# Patient Record
Sex: Male | Born: 1985 | Race: Black or African American | Hispanic: No | Marital: Single | Smoking: Former smoker
Health system: Southern US, Community
[De-identification: ages and names within clinical notes are randomized; demographics above are authoritative.]

## PROBLEM LIST (undated history)

## (undated) DIAGNOSIS — D573 Sickle-cell trait: Secondary | ICD-10-CM

## (undated) HISTORY — PX: ELBOW ARTHROSCOPY: SUR87

---

## 2006-11-12 ENCOUNTER — Emergency Department: Payer: Self-pay | Admitting: Emergency Medicine

## 2006-11-17 ENCOUNTER — Emergency Department: Payer: Self-pay | Admitting: Internal Medicine

## 2009-12-25 ENCOUNTER — Emergency Department: Payer: Self-pay | Admitting: Emergency Medicine

## 2012-05-29 ENCOUNTER — Emergency Department: Payer: Self-pay | Admitting: Emergency Medicine

## 2018-08-29 ENCOUNTER — Other Ambulatory Visit: Payer: Self-pay

## 2018-08-29 ENCOUNTER — Emergency Department: Payer: Self-pay

## 2018-08-29 ENCOUNTER — Encounter: Payer: Self-pay | Admitting: Emergency Medicine

## 2018-08-29 ENCOUNTER — Emergency Department
Admission: EM | Admit: 2018-08-29 | Discharge: 2018-08-30 | Disposition: A | Discharge status: Other Acute Inpatient Hospital | Payer: Self-pay | Attending: Emergency Medicine | Admitting: Emergency Medicine

## 2018-08-29 DIAGNOSIS — S53024A Posterior dislocation of right radial head, initial encounter: Secondary | ICD-10-CM | POA: Insufficient documentation

## 2018-08-29 DIAGNOSIS — S43015A Anterior dislocation of left humerus, initial encounter: Secondary | ICD-10-CM | POA: Insufficient documentation

## 2018-08-29 DIAGNOSIS — Y998 Other external cause status: Secondary | ICD-10-CM | POA: Insufficient documentation

## 2018-08-29 DIAGNOSIS — Y9389 Activity, other specified: Secondary | ICD-10-CM | POA: Insufficient documentation

## 2018-08-29 DIAGNOSIS — T07XXXA Unspecified multiple injuries, initial encounter: Secondary | ICD-10-CM

## 2018-08-29 DIAGNOSIS — T1490XA Injury, unspecified, initial encounter: Secondary | ICD-10-CM

## 2018-08-29 DIAGNOSIS — S4292XA Fracture of left shoulder girdle, part unspecified, initial encounter for closed fracture: Secondary | ICD-10-CM

## 2018-08-29 DIAGNOSIS — Q7191 Unspecified reduction defect of right upper limb: Secondary | ICD-10-CM

## 2018-08-29 DIAGNOSIS — Y929 Unspecified place or not applicable: Secondary | ICD-10-CM | POA: Insufficient documentation

## 2018-08-29 DIAGNOSIS — S42401B Unspecified fracture of lower end of right humerus, initial encounter for open fracture: Secondary | ICD-10-CM

## 2018-08-29 DIAGNOSIS — F17200 Nicotine dependence, unspecified, uncomplicated: Secondary | ICD-10-CM | POA: Insufficient documentation

## 2018-08-29 DIAGNOSIS — Q7192 Unspecified reduction defect of left upper limb: Secondary | ICD-10-CM

## 2018-08-29 DIAGNOSIS — S42202A Unspecified fracture of upper end of left humerus, initial encounter for closed fracture: Secondary | ICD-10-CM | POA: Insufficient documentation

## 2018-08-29 DIAGNOSIS — S52501A Unspecified fracture of the lower end of right radius, initial encounter for closed fracture: Secondary | ICD-10-CM | POA: Insufficient documentation

## 2018-08-29 DIAGNOSIS — S80211A Abrasion, right knee, initial encounter: Secondary | ICD-10-CM | POA: Insufficient documentation

## 2018-08-29 DIAGNOSIS — S51811A Laceration without foreign body of right forearm, initial encounter: Secondary | ICD-10-CM | POA: Insufficient documentation

## 2018-08-29 DIAGNOSIS — S80212A Abrasion, left knee, initial encounter: Secondary | ICD-10-CM | POA: Insufficient documentation

## 2018-08-29 DIAGNOSIS — S0083XA Contusion of other part of head, initial encounter: Secondary | ICD-10-CM | POA: Insufficient documentation

## 2018-08-29 LAB — CBC WITH DIFFERENTIAL/PLATELET
BASOS ABS: 0.1 10*3/uL (ref 0–0.1)
BASOS PCT: 1 %
EOS ABS: 0.2 10*3/uL (ref 0–0.7)
Eosinophils Relative: 1 %
HCT: 44.6 % (ref 40.0–52.0)
HEMOGLOBIN: 15.4 g/dL (ref 13.0–18.0)
LYMPHS ABS: 5.9 10*3/uL — AB (ref 1.0–3.6)
Lymphocytes Relative: 36 %
MCH: 30.1 pg (ref 26.0–34.0)
MCHC: 34.5 g/dL (ref 32.0–36.0)
MCV: 87.2 fL (ref 80.0–100.0)
Monocytes Absolute: 1 10*3/uL (ref 0.2–1.0)
Monocytes Relative: 6 %
NEUTROS PCT: 56 %
Neutro Abs: 9 10*3/uL — ABNORMAL HIGH (ref 1.4–6.5)
Platelets: 328 10*3/uL (ref 150–440)
RBC: 5.12 MIL/uL (ref 4.40–5.90)
RDW: 14.1 % (ref 11.5–14.5)
WBC: 16.2 10*3/uL — AB (ref 3.8–10.6)

## 2018-08-29 LAB — ETHANOL: ALCOHOL ETHYL (B): 125 mg/dL — AB (ref <10)

## 2018-08-29 MED ORDER — ONDANSETRON HCL 4 MG/2ML IJ SOLN
4.0000 mg | Freq: Once | INTRAMUSCULAR | Status: AC
  Administered 2018-08-29: 4 mg via INTRAVENOUS
  Filled 2018-08-29: qty 2

## 2018-08-29 MED ORDER — MORPHINE SULFATE (PF) 4 MG/ML IV SOLN
4.0000 mg | Freq: Once | INTRAVENOUS | Status: AC
  Administered 2018-08-29: 4 mg via INTRAVENOUS
  Filled 2018-08-29: qty 1

## 2018-08-29 MED ORDER — SODIUM CHLORIDE 0.9 % IV BOLUS
1000.0000 mL | Freq: Once | INTRAVENOUS | Status: AC
  Administered 2018-08-29: 1000 mL via INTRAVENOUS
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
## 2018-08-30 ENCOUNTER — Emergency Department: Payer: Self-pay

## 2018-08-30 LAB — URINE DRUG SCREEN, QUALITATIVE (ARMC ONLY)
Amphetamines, Ur Screen: NOT DETECTED
Barbiturates, Ur Screen: NOT DETECTED
Benzodiazepine, Ur Scrn: NOT DETECTED
COCAINE METABOLITE, UR ~~LOC~~: POSITIVE — AB
Cannabinoid 50 Ng, Ur ~~LOC~~: NOT DETECTED
MDMA (ECSTASY) UR SCREEN: NOT DETECTED
METHADONE SCREEN, URINE: NOT DETECTED
Opiate, Ur Screen: NOT DETECTED
Phencyclidine (PCP) Ur S: NOT DETECTED
TRICYCLIC, UR SCREEN: NOT DETECTED

## 2018-08-30 LAB — BASIC METABOLIC PANEL
ANION GAP: 24 — AB (ref 5–15)
BUN: 11 mg/dL (ref 6–20)
CHLORIDE: 103 mmol/L (ref 98–111)
CO2: 11 mmol/L — ABNORMAL LOW (ref 22–32)
Calcium: 9.2 mg/dL (ref 8.9–10.3)
Creatinine, Ser: 1.47 mg/dL — ABNORMAL HIGH (ref 0.61–1.24)
Glucose, Bld: 174 mg/dL — ABNORMAL HIGH (ref 70–99)
POTASSIUM: 4.3 mmol/L (ref 3.5–5.1)
Sodium: 138 mmol/L (ref 135–145)

## 2018-08-30 MED ORDER — ETOMIDATE 2 MG/ML IV SOLN
INTRAVENOUS | Status: AC | PRN
  Administered 2018-08-30: 14 mg via INTRAVENOUS

## 2018-08-30 MED ORDER — KETAMINE HCL 50 MG/ML IJ SOLN
INTRAMUSCULAR | Status: AC
  Filled 2018-08-30: qty 10

## 2018-08-30 MED ORDER — MORPHINE SULFATE (PF) 10 MG/ML IJ SOLN
6.00 | INTRAMUSCULAR | Status: DC
Start: ? — End: 2018-08-30

## 2018-08-30 MED ORDER — CEFAZOLIN SODIUM-DEXTROSE 1-4 GM/50ML-% IV SOLN
1.0000 g | Freq: Once | INTRAVENOUS | Status: AC
  Administered 2018-08-30: 1 g via INTRAVENOUS
  Filled 2018-08-30: qty 50

## 2018-08-30 MED ORDER — ETOMIDATE 2 MG/ML IV SOLN
INTRAVENOUS | Status: AC
  Filled 2018-08-30: qty 10

## 2018-08-30 MED ORDER — KETAMINE HCL 10 MG/ML IJ SOLN
INTRAMUSCULAR | Status: AC | PRN
  Administered 2018-08-30: 100 mg via INTRAVENOUS

## 2018-08-30 MED ORDER — MORPHINE SULFATE (PF) 4 MG/ML IV SOLN
INTRAVENOUS | Status: AC
  Filled 2018-08-30: qty 1

## 2018-08-30 NOTE — Sedation Documentation (Signed)
Left shoulder reduced

## 2018-08-30 NOTE — ED Notes (Signed)
Called for EMS and spoke with Carly.

## 2018-08-30 NOTE — Sedation Documentation (Signed)
Pt able to verbalize pain.

## 2018-08-30 NOTE — Sedation Documentation (Signed)
Dr. [place] Cerise reduced the right elbow.

## 2018-08-30 NOTE — Sedation Documentation (Signed)
Dr. [place] Cerise began the reduction.

## 2018-08-30 NOTE — ED Notes (Signed)
EMTALA checked for completion  
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
## 2019-12-21 IMAGING — DX DG SHOULDER 1V*L*
2 series · 2 of 2 positions shown · non-contrast
Comparison: Left shoulder radiograph from earlier today

CLINICAL DATA: Second reduction attempt

EXAM:
LEFT SHOULDER - 1 VIEW

[shoulder ap]
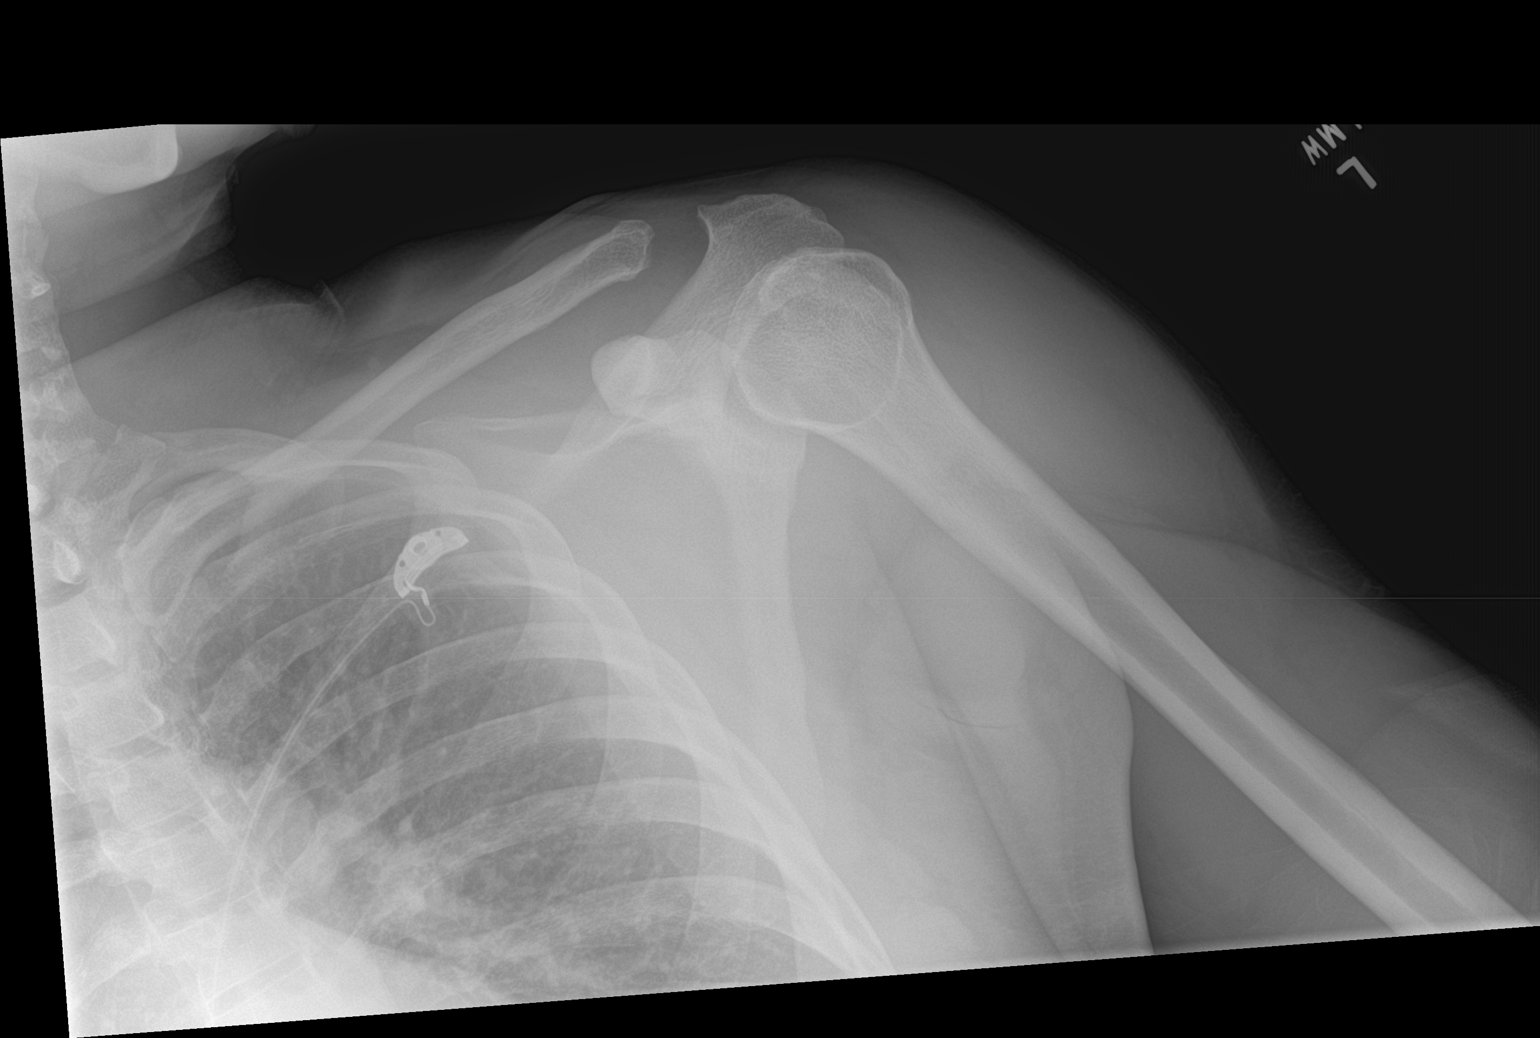

[shoulder obl]
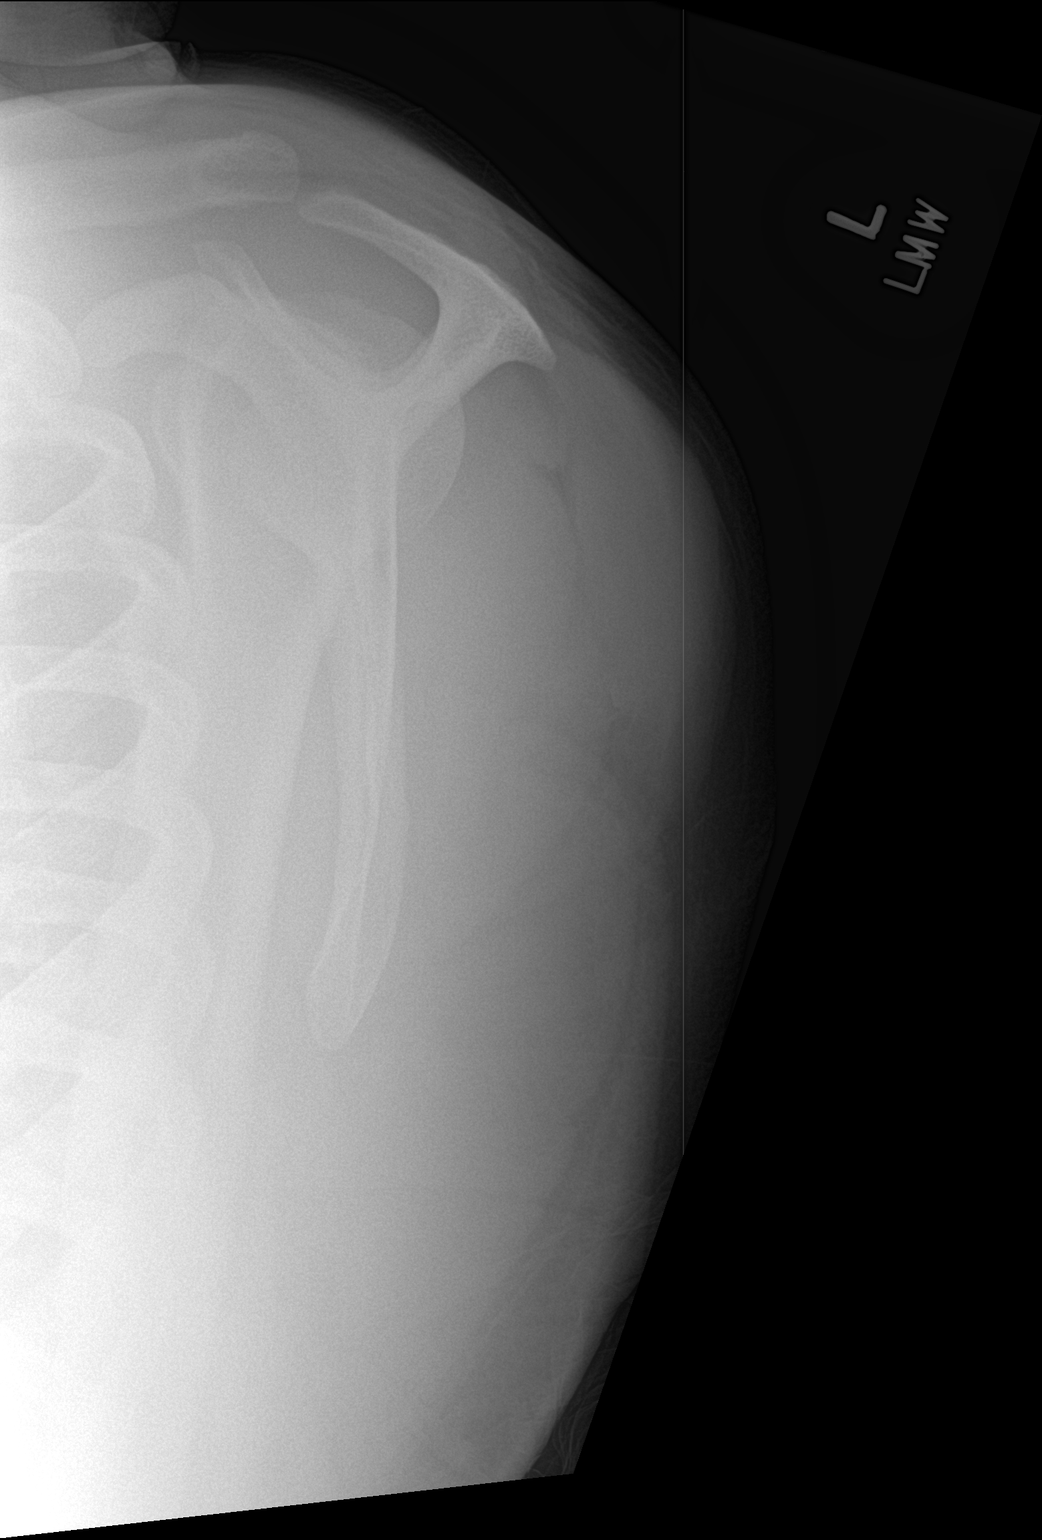

[2 of 2 positions shown; findings below may reference images not displayed]

FINDINGS: Successful reduction with no residual malalignment at the left
glenohumeral joint. Persistent widening at the left
acromioclavicular joint. Hill-Sachs deformity in the left humeral
head superiorly, better seen on prior radiograph. No additional
fracture. No suspicious focal osseous lesion.
IMPRESSION: 1. Successful reduction with no residual malalignment at the left
glenohumeral joint.
2. Persistent widening at the left AC joint suggesting left AC joint
separation of indeterminate chronicity.
3. Hill-Sachs deformity in the left humeral head.

## 2020-07-30 ENCOUNTER — Ambulatory Visit: Payer: Self-pay

## 2021-07-28 ENCOUNTER — Emergency Department
Admission: EM | Admit: 2021-07-28 | Discharge: 2021-07-28 | Disposition: A | Discharge status: Home / Self Care | Payer: Self-pay | Attending: Student in an Organized Health Care Education/Training Program | Admitting: Student in an Organized Health Care Education/Training Program

## 2021-07-28 ENCOUNTER — Other Ambulatory Visit: Payer: Self-pay

## 2021-07-28 DIAGNOSIS — M79601 Pain in right arm: Secondary | ICD-10-CM | POA: Insufficient documentation

## 2021-07-28 DIAGNOSIS — M79604 Pain in right leg: Secondary | ICD-10-CM | POA: Insufficient documentation

## 2021-07-28 DIAGNOSIS — Y92410 Unspecified street and highway as the place of occurrence of the external cause: Secondary | ICD-10-CM | POA: Insufficient documentation

## 2021-07-28 DIAGNOSIS — Z5321 Procedure and treatment not carried out due to patient leaving prior to being seen by health care provider: Secondary | ICD-10-CM | POA: Insufficient documentation

## 2021-07-28 HISTORY — DX: Sickle-cell trait: D57.3
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
## 2021-07-28 NOTE — ED Notes (Signed)
Called pt name x3 to be roomed. No answer. 

## 2022-08-12 ENCOUNTER — Ambulatory Visit: Payer: Self-pay | Admitting: Physician Assistant

## 2022-08-12 ENCOUNTER — Encounter: Payer: Self-pay | Admitting: Physician Assistant

## 2022-08-12 DIAGNOSIS — Z202 Contact with and (suspected) exposure to infections with a predominantly sexual mode of transmission: Secondary | ICD-10-CM

## 2022-08-12 DIAGNOSIS — Z113 Encounter for screening for infections with a predominantly sexual mode of transmission: Secondary | ICD-10-CM

## 2022-08-12 LAB — HM HIV SCREENING LAB: HM HIV Screening: NEGATIVE

## 2022-08-12 MED ORDER — DOXYCYCLINE HYCLATE 100 MG PO TABS: 100.0000 mg | ORAL_TABLET | Freq: Two times a day (BID) | ORAL | 0 refills | Status: AC

## 2022-08-12 NOTE — Progress Notes (Signed)
[facility] Department STI clinic/screening visit  Subjective:  Juan Doyle is a 36 y.o. male being seen today for an STI screening visit. The patient reports they do not have symptoms.    Patient has the following medical conditions:  There are no problems to display for this patient.    Chief Complaint  Patient presents with   SEXUALLY TRANSMITTED DISEASE    Man presents for STD screening and for treatment as contact to partner recently diagnosed with Chlamydia. Pt is asymptomatic.   Does the patient or their partner desires a pregnancy in the next year? Yes Partner currently pregnant.  Screening for MPX risk: Does the patient have an unexplained rash? No has ringworm on neck Is the patient MSM? No Does the patient endorse multiple sex partners or anonymous sex partners? No Did the patient have close or sexual contact with a person diagnosed with MPX? No Has the patient traveled outside the [place] where MPX is endemic? No Is there a high clinical suspicion for MPX-- evidenced by one of the following No  -Unlikely to be chickenpox  -Lymphadenopathy  -Rash that present in same phase of evolution on any given body part  See flowsheet for further details and programmatic requirements.    There is no immunization history on file for this patient.   The following portions of the patient's history were reviewed and updated as appropriate: allergies, current medications, past medical history, past social history, past surgical history and problem list.  Objective:  There were no vitals filed for this visit.  Physical Exam Constitutional:      Appearance: Normal appearance. He is obese.  HENT:     Head: Normocephalic and atraumatic.     Comments: No nits or hair loss    Mouth/Throat:     Mouth: Mucous membranes are moist.     Pharynx: Oropharynx is clear. No oropharyngeal exudate or posterior oropharyngeal erythema.  Cardiovascular:     Rate and Rhythm: Regular  rhythm.  Pulmonary:     Effort: Pulmonary effort is normal.  Abdominal:     General: Abdomen is flat.     Palpations: Abdomen is soft. There is no hepatomegaly or mass.     Tenderness: There is no abdominal tenderness.  Genitourinary:    Pubic Area: No rash or pubic lice (no nits).      Penis: Normal and circumcised. No tenderness, discharge, swelling or lesions.      Testes: Normal.        Right: Mass or tenderness not present.        Left: Mass or tenderness not present.     Epididymis:     Right: Normal. No tenderness.     Left: Normal. No tenderness.     Tanner stage (genital): 5.     Rectum: Normal.  Lymphadenopathy:     Head:     Right side of head: No preauricular or posterior auricular adenopathy.     Left side of head: No preauricular or posterior auricular adenopathy.     Cervical: No cervical adenopathy.     Upper Body:     Right upper body: No supraclavicular, axillary or epitrochlear adenopathy.     Left upper body: No supraclavicular, axillary or epitrochlear adenopathy.     Lower Body: No right inguinal adenopathy. No left inguinal adenopathy.  Skin:    General: Skin is warm and dry.     Findings: No rash.     Comments: Multiple tattoos present,  no rash except oval 1.5 x 3xm area with raised border on L neck  Neurological:     Mental Status: He is alert and oriented to person, place, and time.       Assessment and Plan:  Juan Doyle is a 36 y.o. male presenting to the [facility] Department for STI screening  1. Routine screening for STI (sexually transmitted infection) Probable tinea corporis L neck, f/u with PCP or tx with OTC antifungal. Await STI test results, use condom for STI prevention and recommend STI screen every 6 mo. - Chlamydia/Gonorrhea [facility] - HIV [place] [facility] - Syphilis Serology, [facility]  2. Exposure to sexually transmitted disease (STD) Treat contact to Chlamydia per S.O.   Return in about 6 months (around  02/10/2023) for STI screening.  No future appointments.  Landry Dyke, PA-C

## 2024-08-08 ENCOUNTER — Emergency Department: Payer: Self-pay

## 2024-08-08 ENCOUNTER — Emergency Department
Admission: EM | Admit: 2024-08-08 | Discharge: 2024-08-08 | Disposition: A | Discharge status: Home / Self Care | Payer: Self-pay | Attending: Emergency Medicine | Admitting: Emergency Medicine

## 2024-08-08 ENCOUNTER — Other Ambulatory Visit: Payer: Self-pay

## 2024-08-08 DIAGNOSIS — M545 Low back pain, unspecified: Secondary | ICD-10-CM | POA: Diagnosis present

## 2024-08-08 DIAGNOSIS — Y92410 Unspecified street and highway as the place of occurrence of the external cause: Secondary | ICD-10-CM | POA: Diagnosis not present

## 2024-08-08 DIAGNOSIS — S40012A Contusion of left shoulder, initial encounter: Secondary | ICD-10-CM | POA: Diagnosis not present

## 2024-08-08 DIAGNOSIS — S39012A Strain of muscle, fascia and tendon of lower back, initial encounter: Secondary | ICD-10-CM | POA: Insufficient documentation

## 2024-08-08 MED ORDER — LIDOCAINE 5 % EX PTCH
1.0000 | MEDICATED_PATCH | CUTANEOUS | Status: DC
  Administered 2024-08-08: 1 via TRANSDERMAL
  Filled 2024-08-08: qty 1

## 2024-08-08 MED ORDER — IBUPROFEN 600 MG PO TABS
600.0000 mg | ORAL_TABLET | Freq: Once | ORAL | Status: AC
  Administered 2024-08-08: 600 mg via ORAL
  Filled 2024-08-08: qty 1

## 2024-08-08 MED ORDER — LIDOCAINE 5 % EX PTCH: 1.0000 | MEDICATED_PATCH | Freq: Two times a day (BID) | CUTANEOUS | 0 refills | Status: AC

## 2024-08-08 MED ORDER — ACETAMINOPHEN 325 MG PO TABS
650.0000 mg | ORAL_TABLET | Freq: Once | ORAL | Status: AC
  Administered 2024-08-08: 650 mg via ORAL
  Filled 2024-08-08: qty 2
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
## 2024-08-08 NOTE — Discharge Instructions (Signed)
 Your imaging was normal today.  You may take Tylenol /ibuprofen  per package instructions to help with your symptoms.  Please return for any new, worsening, or changing symptoms or other concerns.  It was a pleasure caring for you today.

## 2024-08-10 ENCOUNTER — Emergency Department
Admission: EM | Admit: 2024-08-10 | Discharge: 2024-08-10 | Disposition: A | Discharge status: Home / Self Care | Payer: Self-pay | Attending: Emergency Medicine | Admitting: Emergency Medicine

## 2024-08-10 ENCOUNTER — Emergency Department: Payer: Self-pay

## 2024-08-10 DIAGNOSIS — M545 Low back pain, unspecified: Secondary | ICD-10-CM | POA: Insufficient documentation

## 2024-08-10 DIAGNOSIS — Y92410 Unspecified street and highway as the place of occurrence of the external cause: Secondary | ICD-10-CM | POA: Insufficient documentation

## 2024-08-10 DIAGNOSIS — S161XXA Strain of muscle, fascia and tendon at neck level, initial encounter: Secondary | ICD-10-CM | POA: Insufficient documentation

## 2024-08-10 DIAGNOSIS — S199XXA Unspecified injury of neck, initial encounter: Secondary | ICD-10-CM | POA: Diagnosis present

## 2024-08-10 MED ORDER — NAPROXEN 500 MG PO TABS: 500.0000 mg | ORAL_TABLET | Freq: Two times a day (BID) | ORAL | 0 refills | Status: AC

## 2024-08-10 MED ORDER — CYCLOBENZAPRINE HCL 10 MG PO TABS: 10.0000 mg | ORAL_TABLET | Freq: Three times a day (TID) | ORAL | 0 refills | Status: AC | PRN
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
## 2024-08-10 NOTE — Discharge Instructions (Signed)
 Please follow-up with primary care or urgent care if symptoms are not improving.  Return to the emergency department for symptoms of change or worsen if you are unable to schedule an appointment.
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
